# Patient Record
Sex: Male | Born: 1970 | Hispanic: No | Marital: Single | Smoking: Never smoker
Health system: Southern US, Community
[De-identification: ages and names within clinical notes are randomized; demographics above are authoritative.]

## PROBLEM LIST (undated history)

## (undated) DIAGNOSIS — I10 Essential (primary) hypertension: Secondary | ICD-10-CM

---

## 1998-04-04 ENCOUNTER — Encounter: Admission: RE | Admit: 1998-04-04 | Discharge: 1998-04-04 | Payer: Self-pay | Admitting: Family Medicine

## 2016-01-30 DIAGNOSIS — I10 Essential (primary) hypertension: Secondary | ICD-10-CM | POA: Insufficient documentation

## 2016-01-30 DIAGNOSIS — E785 Hyperlipidemia, unspecified: Secondary | ICD-10-CM | POA: Insufficient documentation

## 2016-08-24 DIAGNOSIS — R7303 Prediabetes: Secondary | ICD-10-CM | POA: Insufficient documentation

## 2017-04-05 DIAGNOSIS — M778 Other enthesopathies, not elsewhere classified: Secondary | ICD-10-CM | POA: Insufficient documentation

## 2019-05-25 ENCOUNTER — Other Ambulatory Visit: Payer: Self-pay

## 2019-05-25 ENCOUNTER — Emergency Department (HOSPITAL_BASED_OUTPATIENT_CLINIC_OR_DEPARTMENT_OTHER)
Admission: EM | Admit: 2019-05-25 | Discharge: 2019-05-25 | Disposition: A | Discharge status: Home / Self Care | Payer: BC Managed Care – PPO | Attending: Emergency Medicine | Admitting: Emergency Medicine

## 2019-05-25 ENCOUNTER — Encounter (HOSPITAL_BASED_OUTPATIENT_CLINIC_OR_DEPARTMENT_OTHER): Payer: Self-pay | Admitting: Emergency Medicine

## 2019-05-25 DIAGNOSIS — I10 Essential (primary) hypertension: Secondary | ICD-10-CM | POA: Insufficient documentation

## 2019-05-25 DIAGNOSIS — L03011 Cellulitis of right finger: Secondary | ICD-10-CM | POA: Diagnosis not present

## 2019-05-25 DIAGNOSIS — Z4800 Encounter for change or removal of nonsurgical wound dressing: Secondary | ICD-10-CM | POA: Diagnosis present

## 2019-05-25 DIAGNOSIS — Z5189 Encounter for other specified aftercare: Secondary | ICD-10-CM

## 2019-05-25 HISTORY — DX: Essential (primary) hypertension: I10

## 2019-05-25 NOTE — Discharge Instructions (Addendum)
Get help right away if: The area of redness is spreading, or you notice a red streak going away from your fingertip. You have a fever.

## 2019-05-25 NOTE — ED Provider Notes (Signed)
[facility] EMERGENCY DEPARTMENT Provider Note   CSN: [id number] Arrival date & time: 05/25/19  1526     History Chief Complaint  Patient presents with  . Wound Check    Frank Palmer is a 49 y.o. male who presents  For wound check of his finger. There is a language barrier and the daughter is interpreting. Patient had a paronychia of the right index finger.  He had some pain developing on somewhere ninth and then it developed pus which opened on the 28th.  Since then he has been to a clinic twice.   He is concerned that it is still firm to the touch and there is some peeling on the finger. The patient also had some itching without a rash after taking his oral antibiotics.  He has no fevers or chills.  HPI     Past Medical History:  Diagnosis Date  . Hypertension     There are no problems to display for this patient.   History reviewed. No pertinent surgical history.     No family history on file.  Social History   Tobacco Use  . Smoking status: Never Smoker  . Smokeless tobacco: Never Used  Substance Use Topics  . Alcohol use: Not Currently  . Drug use: Not on file    Home Medications Prior to Admission medications   Not on File    Allergies    Patient has no known allergies.  Review of Systems   Review of Systems  Constitutional: Negative for chills and fever.  Musculoskeletal: Negative for joint swelling.  Skin: Positive for wound.  Neurological: Negative for weakness and numbness.    Physical Exam Updated Vital Signs BP (!) 151/85 (BP Location: Left Arm)   Pulse 72   Temp 99 F (37.2 C) (Oral)   Resp 16   Ht 5\' 8"  (1.727 m)   Wt 62.6 kg   SpO2 100%   BMI 20.98 kg/m   Physical Exam Vitals and nursing note reviewed.  Constitutional:      General: He is not in acute distress.    Appearance: He is well-developed. He is not diaphoretic.  HENT:     Head: Normocephalic and atraumatic.  Eyes:     General: No scleral icterus.  Conjunctiva/sclera: Conjunctivae normal.  Cardiovascular:     Rate and Rhythm: Normal rate and regular rhythm.     Heart sounds: Normal heart sounds.  Pulmonary:     Effort: Pulmonary effort is normal. No respiratory distress.     Breath sounds: Normal breath sounds.  Abdominal:     Palpations: Abdomen is soft.     Tenderness: There is no abdominal tenderness.  Musculoskeletal:     Cervical back: Normal range of motion and neck supple.     Comments: R index finger with slight firmness, Mild desquamation on the radial nail edge, pink tissue. No tenderness, heat, warmth or swelling.    Skin:    General: Skin is warm and dry.  Neurological:     Mental Status: He is alert.  Psychiatric:        Behavior: Behavior normal.     Comments: Anxious, ruminating, obsessing     ED Results / Procedures / Treatments   Labs (all labs ordered are listed, but only abnormal results are displayed) Labs Reviewed - No data to display  EKG None  Radiology No results found.  Procedures Procedures (including critical care time)  Medications Ordered in ED Medications - No data to  display  ED Course  I have reviewed the triage vital signs and the nursing notes.  Pertinent labs & imaging results that were available during my care of the patient were reviewed by me and considered in my medical decision making (see chart for details).    MDM Rules/Calculators/A&P                      Patient with previous paronychia with normal healing tissue. Patient repeatedly obsesses and questions each detail of his finger he finds abnormal. His daughter confides that he is an extremely anxious person. I have reassured the patient that his finger looks great and he has nothing to worry about.  I have repeated multiple times reasons to have the finger reevaluated. Patient appears appropriate for discharge. He does not need follow up  Unless he develops sxs of reoccurring infection.    Final Clinical  Impression(s) / ED Diagnoses Final diagnoses:  Visit for wound check    Rx / DC Orders ED Discharge Orders    None       Margarita Mail, PA-C 05/27/19 0938    Malvin Johns, MD 06/07/19 [id number]

## 2019-05-25 NOTE — ED Triage Notes (Addendum)
Pt recently finished abx for an infected cuticle to R index finger. States he would like it rechecked. He speaks spanish. His daughter is interpreting.

## 2019-06-20 ENCOUNTER — Encounter (HOSPITAL_BASED_OUTPATIENT_CLINIC_OR_DEPARTMENT_OTHER): Payer: Self-pay | Admitting: *Deleted

## 2019-06-20 ENCOUNTER — Emergency Department (HOSPITAL_BASED_OUTPATIENT_CLINIC_OR_DEPARTMENT_OTHER)
Admission: EM | Admit: 2019-06-20 | Discharge: 2019-06-20 | Disposition: A | Discharge status: Home / Self Care | Payer: BC Managed Care – PPO | Attending: Emergency Medicine | Admitting: Emergency Medicine

## 2019-06-20 ENCOUNTER — Other Ambulatory Visit: Payer: Self-pay

## 2019-06-20 DIAGNOSIS — M79644 Pain in right finger(s): Secondary | ICD-10-CM | POA: Diagnosis present

## 2019-06-20 DIAGNOSIS — I10 Essential (primary) hypertension: Secondary | ICD-10-CM | POA: Diagnosis not present

## 2019-06-20 NOTE — ED Provider Notes (Signed)
[facility] EMERGENCY DEPARTMENT Provider Note   CSN: [id number] Arrival date & time: 06/20/19  1810     History Chief Complaint  Patient presents with  . Hand Injury    Frank Palmer is a 49 y.o. male who presents to the ED today complaining of continued pain to his right 2nd digit x 2 months. Per chart review pt was seen at urgent care on 12/15 for paronychia. He was discharged home with abx and returned 3 days later with continued pain. His abx were changed at that point to Augmentin x 10 days and he reports he finished the course. He reports they attempted I&D at that point with only bloody drainage. Pt states he went home and 2 days later attempted drainage himself with purulent material. Pt states that even despite finishing the abx he continues to have inflammation to the area. Pt denies any severe pain but reports he has mild throbbing pain when he puts his hand down by his side. He has not taken anything for the pain.   Per chart review pt was seen in the ED on 1/07 for continued pain and wound check. At that time pt's finger appeared to be without signs of infection and was well healed. He was reassured that his finger looked well but it appears he repeatedly obsessed over his finger and asked multiple questions regarding his fingers; there was concern for anxiety at that point per pt's daughter.    The history is provided by the patient. The history is limited by a language barrier. A language interpreter was used.       Past Medical History:  Diagnosis Date  . Hypertension     There are no problems to display for this patient.   History reviewed. No pertinent surgical history.     History reviewed. No pertinent family history.  Social History   Tobacco Use  . Smoking status: Never Smoker  . Smokeless tobacco: Never Used  Substance Use Topics  . Alcohol use: Not Currently  . Drug use: Not on file    Home Medications Prior to Admission medications    Not on File    Allergies    Patient has no known allergies.  Review of Systems   Review of Systems  Constitutional: Negative for chills and fever.  Musculoskeletal: Positive for arthralgias.    Physical Exam Updated Vital Signs BP 136/72   Pulse 78   Temp 98.7 F (37.1 C) (Oral)   Resp 18   SpO2 99%   Physical Exam Vitals and nursing note reviewed.  Constitutional:      Appearance: He is not ill-appearing.  HENT:     Head: Normocephalic and atraumatic.  Eyes:     Conjunctiva/sclera: Conjunctivae normal.  Cardiovascular:     Rate and Rhythm: Normal rate and regular rhythm.  Pulmonary:     Effort: Pulmonary effort is normal.     Breath sounds: Normal breath sounds.  Musculoskeletal:     Comments: No erythema, edema, or drainage to R index finger. No tenderness to palpation. Mild desquamation of nail fold on lateral aspect of finger. ROM intact to MCP, PIP, and DIP joint. No tenderness to flexor tendon. No circumferential swelling. 2+ radial pulse.   Skin:    General: Skin is warm and dry.     Coloration: Skin is not jaundiced.  Neurological:     Mental Status: He is alert.     ED Results / Procedures / Treatments   Labs (  all labs ordered are listed, but only abnormal results are displayed) Labs Reviewed - No data to display  EKG None  Radiology No results found.  Procedures Procedures (including critical care time)  Medications Ordered in ED Medications - No data to display  ED Course  I have reviewed the triage vital signs and the nursing notes.  Pertinent labs & imaging results that were available during my care of the patient were reviewed by me and considered in my medical decision making (see chart for details).  49 year old Spanish-speaking male who presents the ED for continued in of his right index finger status post paronychia in December where he was started on antibiotics and finished course of.  He has been seen multiple times in the  urgent care clinic as well as here in the ED for same with continued complaint of concern for infection.  On arrival to the ED today patient is afebrile, nontachycardic and nontachypneic.  There are no signs of infection to his nailbed to suggest paronychia.  No signs of felon.  No concern for flexor tenosynovitis.  His finger looks well-healed today.  I had lengthy discussion with him via interpretive services regarding his finger.  Pt continues to interrupt the interpretor and myself to repeat multiple times what has occurred over the past 2 months regarding his finger. Again his finger looks well healed today; there was concern about anxiety from his daughter during last ED visit and I feel that this is likely today. I have advised patient that he should follow up with his PCP regarding this and should take Ibuprofen and Tylenol as needed for pain. Strict return precautions have been discussed including signs of infection that are new in the past couple of days; pt continues to report he did have drainage at one point in December but I have advised that if he does not have anymore drainage,  New swelling, new redness that there are no signs of infection currently. Pt stable for discharge home.   This note was prepared using Dragon voice recognition software and may include unintentional dictation errors due to the inherent limitations of voice recognition software.     MDM Rules/Calculators/A&P                      Final Clinical Impression(s) / ED Diagnoses Final diagnoses:  Pain of finger of right hand    Rx / DC Orders ED Discharge Orders    None       Discharge Instructions     Please follow up with your PCP regarding your continued finger pain. Your finger does not appear infected today.   Take Ibuprofen and Tylenol as needed for pain.        Eustaquio Maize, PA-C 06/20/19 1906    Fredia Sorrow, MD 06/25/19 0800

## 2019-06-20 NOTE — Discharge Instructions (Signed)
Please follow up with your PCP regarding your continued finger pain. Your finger does not appear infected today.   Take Ibuprofen and Tylenol as needed for pain.

## 2019-06-20 NOTE — ED Notes (Signed)
ED Provider at bedside. 

## 2019-06-20 NOTE — ED Triage Notes (Addendum)
ipad translator used Pt c/o right index finger pain x 1 day , recent infection with completion of ABX

## 2019-07-11 ENCOUNTER — Other Ambulatory Visit: Payer: Self-pay

## 2019-07-11 ENCOUNTER — Ambulatory Visit: Payer: BC Managed Care – PPO | Admitting: Registered Nurse

## 2019-07-11 ENCOUNTER — Encounter: Payer: Self-pay | Admitting: Registered Nurse

## 2019-07-11 VITALS — BP 136/82 | HR 64 | Temp 98.0°F | Ht 68.0 in | Wt 129.8 lb

## 2019-07-11 DIAGNOSIS — R21 Rash and other nonspecific skin eruption: Secondary | ICD-10-CM

## 2019-07-11 DIAGNOSIS — S93529A Sprain of metatarsophalangeal joint of unspecified toe(s), initial encounter: Secondary | ICD-10-CM | POA: Diagnosis not present

## 2019-07-11 DIAGNOSIS — L03011 Cellulitis of right finger: Secondary | ICD-10-CM

## 2019-07-11 DIAGNOSIS — Z1329 Encounter for screening for other suspected endocrine disorder: Secondary | ICD-10-CM

## 2019-07-11 DIAGNOSIS — Z13228 Encounter for screening for other metabolic disorders: Secondary | ICD-10-CM

## 2019-07-11 DIAGNOSIS — Z130 Encounter for screening for diseases of the blood and blood-forming organs and certain disorders involving the immune mechanism: Secondary | ICD-10-CM

## 2019-07-11 DIAGNOSIS — Z13220 Encounter for screening for lipoid disorders: Secondary | ICD-10-CM

## 2019-07-11 MED ORDER — DOXYCYCLINE HYCLATE 100 MG PO TABS: 100.0000 mg | ORAL_TABLET | Freq: Two times a day (BID) | ORAL | 0 refills | Status: AC

## 2019-07-11 MED ORDER — MELOXICAM 7.5 MG PO TABS: 7.5000 mg | ORAL_TABLET | Freq: Every day | ORAL | 0 refills | Status: AC

## 2019-07-11 NOTE — Patient Instructions (Signed)
° ° ° °  If you have lab work done today you will be contacted with your lab results within the next 2 weeks.  If you have not heard from us then please contact us. The fastest way to get your results is to register for My Chart. ° ° °IF you received an x-ray today, you will receive an invoice from [facility]. Please contact  Radiology at [phone] with questions or concerns regarding your invoice.  ° °IF you received labwork today, you will receive an invoice from [facility]. Please contact [facility] at [phone] with questions or concerns regarding your invoice.  ° °Our billing staff will not be able to assist you with questions regarding bills from these companies. ° °You will be contacted with the lab results as soon as they are available. The fastest way to get your results is to activate your My Chart account. Instructions are located on the last page of this paperwork. If you have not heard from us regarding the results in 2 weeks, please contact this office. °  ° ° ° °

## 2019-07-11 NOTE — Progress Notes (Signed)
Established Patient Office Visit  Subjective:  Patient ID: Frank Palmer, male    DOB: 1970-09-20  Age: 49 y.o. MRN: [id number]  CC:  Chief Complaint  Patient presents with  . New Patient (Initial Visit)    establish care . right hand and index finger was infected two months ago and went to ED they told him it would go away over time but now the whole hand is starting to swell about three weeks ago. when it swell patient stated it burns bad but doesnt lose feeling.    HPI Frank Palmer presents for paronychia of digit on r hand  Originally seen by urgent care for this issue two months ago. Had not improved with keflex or augmentin. Briefly resolved on its own, now worsening. Finger tips are swollen, painful, and some drainage from base of nail. No numbness or weakness.   Also notes malar rash - has been ongoing for around 2 weeks. Improving. No discrete lesions, rash is poorly circumscribed.   Also states that he has pain in the joint of his great toe on his left foot. Aching pain when walking improves with rest. Slow onset. Has not happened before. No history of gout.   Past Medical History:  Diagnosis Date  . Hypertension     No past surgical history on file.  No family history on file.  Social History   Socioeconomic History  . Marital status: Single    Spouse name: Not on file  . Number of children: Not on file  . Years of education: Not on file  . Highest education level: Not on file  Occupational History  . Not on file  Tobacco Use  . Smoking status: Never Smoker  . Smokeless tobacco: Never Used  Substance and Sexual Activity  . Alcohol use: Not Currently  . Drug use: Not on file  . Sexual activity: Not on file  Other Topics Concern  . Not on file  Social History Narrative  . Not on file   Social Determinants of Health   Financial Resource Strain:   . Difficulty of Paying Living Expenses: Not on file  Food Insecurity:   . Worried About Ship broker in the Last Year: Not on file  . Ran Out of Food in the Last Year: Not on file  Transportation Needs:   . Lack of Transportation (Medical): Not on file  . Lack of Transportation (Non-Medical): Not on file  Physical Activity:   . Days of Exercise per Week: Not on file  . Minutes of Exercise per Session: Not on file  Stress:   . Feeling of Stress : Not on file  Social Connections:   . Frequency of Communication with Friends and Family: Not on file  . Frequency of Social Gatherings with Friends and Family: Not on file  . Attends Religious Services: Not on file  . Active Member of Clubs or Organizations: Not on file  . Attends Archivist Meetings: Not on file  . Marital Status: Not on file  Intimate Partner Violence:   . Fear of Current or Ex-Partner: Not on file  . Emotionally Abused: Not on file  . Physically Abused: Not on file  . Sexually Abused: Not on file    Outpatient Medications Prior to Visit  Medication Sig Dispense Refill  . atenolol-chlorthalidone (TENORETIC) 50-25 MG tablet Take 1 tablet by mouth daily.    . metFORMIN (GLUCOPHAGE) 500 MG tablet Take 500 mg by mouth daily with  breakfast.     No facility-administered medications prior to visit.    Allergies  Allergen Reactions  . Amoxicillin-Pot Clavulanate     ROS Review of Systems Per hpi   Objective:    Physical Exam  Constitutional: He is oriented to person, place, and time. He appears well-developed and well-nourished. No distress.  Cardiovascular: Normal rate and regular rhythm.  Pulmonary/Chest: Effort normal. No respiratory distress.  Musculoskeletal:        General: No tenderness, deformity or edema. Normal range of motion.  Neurological: He is alert and oriented to person, place, and time.  Skin: Skin is warm and dry. Rash (malar) noted. He is not diaphoretic. There is erythema (finger tips R hand). No pallor.  Psychiatric: He has a normal mood and affect. His behavior is normal.  Judgment and thought content normal.  Nursing note and vitals reviewed.   BP 136/82   Pulse 64   Temp 98 F (36.7 C) (Temporal)   Ht '5\' 8"'  (1.727 m)   Wt 129 lb 12.8 oz (58.9 kg)   SpO2 96%   BMI 19.74 kg/m  Wt Readings from Last 3 Encounters:  07/11/19 129 lb 12.8 oz (58.9 kg)  05/25/19 138 lb (62.6 kg)     Health Maintenance Due  Topic Date Due  . HIV Screening  02/11/1985    There are no preventive care reminders to display for this patient.  No results found for: TSH No results found for: WBC, HGB, HCT, MCV, PLT No results found for: NA, K, CHLORIDE, CO2, GLUCOSE, BUN, CREATININE, BILITOT, ALKPHOS, AST, ALT, PROT, ALBUMIN, CALCIUM, ANIONGAP, EGFR, GFR No results found for: CHOL No results found for: HDL No results found for: LDLCALC No results found for: TRIG No results found for: CHOLHDL No results found for: HGBA1C    Assessment & Plan:   Problem List Items Addressed This Visit    None    Visit Diagnoses    Lipid screening    -  Primary   Relevant Orders   Lipid Panel   Screening for endocrine, metabolic and immunity disorder       Relevant Orders   CBC with Differential   Hemoglobin A1c   Malar rash       Relevant Orders   ANA   Turf toe, initial encounter       Relevant Medications   meloxicam (MOBIC) 7.5 MG tablet   Paronychia of finger of right hand       Relevant Medications   doxycycline (VIBRA-TABS) 100 MG tablet      Meds ordered this encounter  Medications  . doxycycline (VIBRA-TABS) 100 MG tablet    Sig: Take 1 tablet (100 mg total) by mouth 2 (two) times daily.    Dispense:  20 tablet    Refill:  0    Order Specific Question:   Supervising Provider    Answer:   Delia Chimes A [id number]  . meloxicam (MOBIC) 7.5 MG tablet    Sig: Take 1 tablet (7.5 mg total) by mouth daily.    Dispense:  30 tablet    Refill:  0    Order Specific Question:   Supervising Provider    Answer:   Forrest Moron [id number]    Follow-up: No  follow-ups on file.   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 Will draw ANA and other labs to be certain, will follow up as warranted  Patient encouraged to call clinic with any questions, comments, or concerns.  Maximiano Coss, NP

## 2019-07-12 LAB — CBC WITH DIFFERENTIAL/PLATELET
Basophils Absolute: 0.1 10*3/uL (ref 0.0–0.2)
Basos: 1 %
EOS (ABSOLUTE): 0.5 10*3/uL — ABNORMAL HIGH (ref 0.0–0.4)
Eos: 8 %
Hematocrit: 49.2 % (ref 37.5–51.0)
Hemoglobin: 17 g/dL (ref 13.0–17.7)
Immature Grans (Abs): 0 10*3/uL (ref 0.0–0.1)
Immature Granulocytes: 0 %
Lymphocytes Absolute: 1.2 10*3/uL (ref 0.7–3.1)
Lymphs: 21 %
MCH: 29 pg (ref 26.6–33.0)
MCHC: 34.6 g/dL (ref 31.5–35.7)
MCV: 84 fL (ref 79–97)
Monocytes Absolute: 0.6 10*3/uL (ref 0.1–0.9)
Monocytes: 10 %
Neutrophils Absolute: 3.5 10*3/uL (ref 1.4–7.0)
Neutrophils: 60 %
Platelets: 259 10*3/uL (ref 150–450)
RBC: 5.87 x10E6/uL — ABNORMAL HIGH (ref 4.14–5.80)
RDW: 13.7 % (ref 11.6–15.4)
WBC: 5.8 10*3/uL (ref 3.4–10.8)

## 2019-07-12 LAB — LIPID PANEL
Chol/HDL Ratio: 4.1 ratio (ref 0.0–5.0)
Cholesterol, Total: 187 mg/dL (ref 100–199)
HDL: 46 mg/dL (ref >39)
LDL Chol Calc (NIH): 124 mg/dL — ABNORMAL HIGH (ref 0–99)
Triglycerides: 93 mg/dL (ref 0–149)
VLDL Cholesterol Cal: 17 mg/dL (ref 5–40)

## 2019-07-12 LAB — HEMOGLOBIN A1C
Est. average glucose Bld gHb Est-mCnc: 123 mg/dL
Hgb A1c MFr Bld: 5.9 % — ABNORMAL HIGH (ref 4.8–5.6)

## 2019-07-12 LAB — ANA: Anti Nuclear Antibody (ANA): NEGATIVE

## 2019-07-12 NOTE — Progress Notes (Signed)
Good afternoon,  If we could give Frank Palmer a call to let him know that his labs are not contributory to his symptoms, that would be great.  We did note that his sugar and his cholesterol were a little high - while this doesn't warrant medication at this time, he should try to improve his diet and exercise routines. It is okay to speak with his daughter, who interprets for him  Thank you,  Jari Sportsman, NP

## 2019-07-13 ENCOUNTER — Telehealth: Payer: Self-pay

## 2019-07-18 ENCOUNTER — Encounter: Payer: Self-pay | Admitting: Family Medicine

## 2019-07-18 ENCOUNTER — Ambulatory Visit: Payer: BC Managed Care – PPO | Admitting: Family Medicine

## 2019-07-18 ENCOUNTER — Other Ambulatory Visit: Payer: Self-pay | Admitting: Family Medicine

## 2019-07-18 ENCOUNTER — Other Ambulatory Visit: Payer: Self-pay

## 2019-07-18 ENCOUNTER — Ambulatory Visit (INDEPENDENT_AMBULATORY_CARE_PROVIDER_SITE_OTHER): Payer: BC Managed Care – PPO

## 2019-07-18 VITALS — BP 125/79 | HR 67 | Temp 98.7°F | Ht 68.0 in | Wt 131.0 lb

## 2019-07-18 DIAGNOSIS — M79644 Pain in right finger(s): Secondary | ICD-10-CM

## 2019-07-18 DIAGNOSIS — R202 Paresthesia of skin: Secondary | ICD-10-CM

## 2019-07-18 MED ORDER — GABAPENTIN 100 MG PO CAPS: 100.0000 mg | ORAL_CAPSULE | Freq: Three times a day (TID) | ORAL | 3 refills | Status: AC

## 2019-07-18 NOTE — Patient Instructions (Signed)
° ° ° °  If you have lab work done today you will be contacted with your lab results within the next 2 weeks.  If you have not heard from us then please contact us. The fastest way to get your results is to register for My Chart. ° ° °IF you received an x-ray today, you will receive an invoice from [facility]. Please contact [facility] at [phone] with questions or concerns regarding your invoice.  ° °IF you received labwork today, you will receive an invoice from [facility]. Please contact [facility] at [phone] with questions or concerns regarding your invoice.  ° °Our billing staff will not be able to assist you with questions regarding bills from these companies. ° °You will be contacted with the lab results as soon as they are available. The fastest way to get your results is to activate your My Chart account. Instructions are located on the last page of this paperwork. If you have not heard from us regarding the results in 2 weeks, please contact this office. °  ° ° ° °

## 2019-07-18 NOTE — Progress Notes (Signed)
3/2/20213:08 PM  Frank Palmer 1970-12-12, 49 y.o., male [id number]  Chief Complaint  Patient presents with  . Follow-up    R hand index finger,     HPI:   Patient is a 49 y.o. male with past medical history significant for prediabetes and HLP who presents today with hand and leg paresthesia  Patient presents for concerns of paresthesia He reports that it started after being treated for paronychia of right index finger in dec 2020 He reports that since then he feels both his hands and toes are swollen and having burning pain He denies any weakness or changes in color He has seen multiple providers for this CBC, ANA, A1c normal  Depression screen [facility] 2/9 07/18/2019 07/11/2019  Decreased Interest 0 0  Down, Depressed, Hopeless 0 0  PHQ - 2 Score 0 0    Fall Risk  07/18/2019 07/11/2019  Falls in the past year? 0 0  Number falls in past yr: 0 0  Injury with Fall? 0 -  Follow up Falls evaluation completed Falls evaluation completed     Allergies  Allergen Reactions  . Amoxicillin-Pot Clavulanate     Rash, itching     Prior to Admission medications   Medication Sig Start Date End Date Taking? Authorizing Provider  atenolol-chlorthalidone (TENORETIC) 50-25 MG tablet Take 1 tablet by mouth daily.   Yes [provider]  doxycycline (VIBRA-TABS) 100 MG tablet Take 1 tablet (100 mg total) by mouth 2 (two) times daily. 07/11/19  Yes Janeece Agee, NP  meloxicam (MOBIC) 7.5 MG tablet Take 1 tablet (7.5 mg total) by mouth daily. 07/11/19  Yes Janeece Agee, NP  metFORMIN (GLUCOPHAGE) 500 MG tablet Take 500 mg by mouth daily with breakfast.   Yes [provider]    Past Medical History:  Diagnosis Date  . Hypertension     No past surgical history on file.  Social History   Tobacco Use  . Smoking status: Never Smoker  . Smokeless tobacco: Never Used  Substance Use Topics  . Alcohol use: Not Currently    No family history on file.  Review of  Systems  Constitutional: Negative for chills and fever.  Respiratory: Negative for cough and shortness of breath.   Cardiovascular: Negative for chest pain, palpitations and leg swelling.  Gastrointestinal: Negative for abdominal pain, nausea and vomiting.   Per hpi   OBJECTIVE:  Today's Vitals   07/18/19 1503  BP: 125/79  Pulse: 67  Temp: 98.7 F (37.1 C)  TempSrc: Temporal  SpO2: 97%  Weight: 131 lb (59.4 kg)  Height: 5\' 8"  (1.727 m)   Body mass index is 19.92 kg/m.   Physical Exam Vitals and nursing note reviewed.  Constitutional:      Appearance: He is well-developed.  HENT:     Head: Normocephalic and atraumatic.  Eyes:     Conjunctiva/sclera: Conjunctivae normal.     Pupils: Pupils are equal, round, and reactive to light.  Cardiovascular:     Rate and Rhythm: Normal rate and regular rhythm.     Pulses: Normal pulses.     Heart sounds: No murmur. No friction rub. No gallop.   Pulmonary:     Effort: Pulmonary effort is normal.     Breath sounds: Normal breath sounds. No wheezing or rales.  Musculoskeletal:        General: No swelling or deformity.     Cervical back: Neck supple.  Skin:    General: Skin is warm and dry.  Neurological:     Mental Status: He is alert and oriented to person, place, and time.     Sensory: Sensation is intact.     Motor: No weakness.     Deep Tendon Reflexes: Reflexes are normal and symmetric.     Comments: Neg phalen and tinsel     No results found for this or any previous visit (from the past 24 hour(s)).  DG Hand Complete Right  Result Date: 07/18/2019 CLINICAL DATA:  Finger pain EXAM: RIGHT HAND - COMPLETE 3+ VIEW COMPARISON:  None. FINDINGS: There is no evidence of fracture or dislocation. There is no evidence of arthropathy or other focal bone abnormality. Soft tissues are unremarkable. IMPRESSION: Negative. Electronically Signed   By: Franchot Gallo M.D.   On: 07/18/2019 15:55     ASSESSMENT and PLAN  1. Finger  pain, right 2. Paresthesia Exam and evaluation reassuring. Trial of gabapentin, reviewed r/se/b. Consider titration.  Other orders - gabapentin (NEURONTIN) 100 MG capsule; Take 1 capsule (100 mg total) by mouth 3 (three) times daily.  No follow-ups on file.    Rutherford Guys, MD [facility] [place], [place] [address] Ph.  [phone] Fax [phone]

## 2019-07-28 NOTE — Telephone Encounter (Signed)
No action needed

## 2021-06-05 IMAGING — DX DG HAND COMPLETE 3+V*R*
3 series · 3 of 3 positions shown · non-contrast
Comparison: None.

CLINICAL DATA: Finger pain

EXAM:
RIGHT HAND - COMPLETE 3+ VIEW

[hand pa]
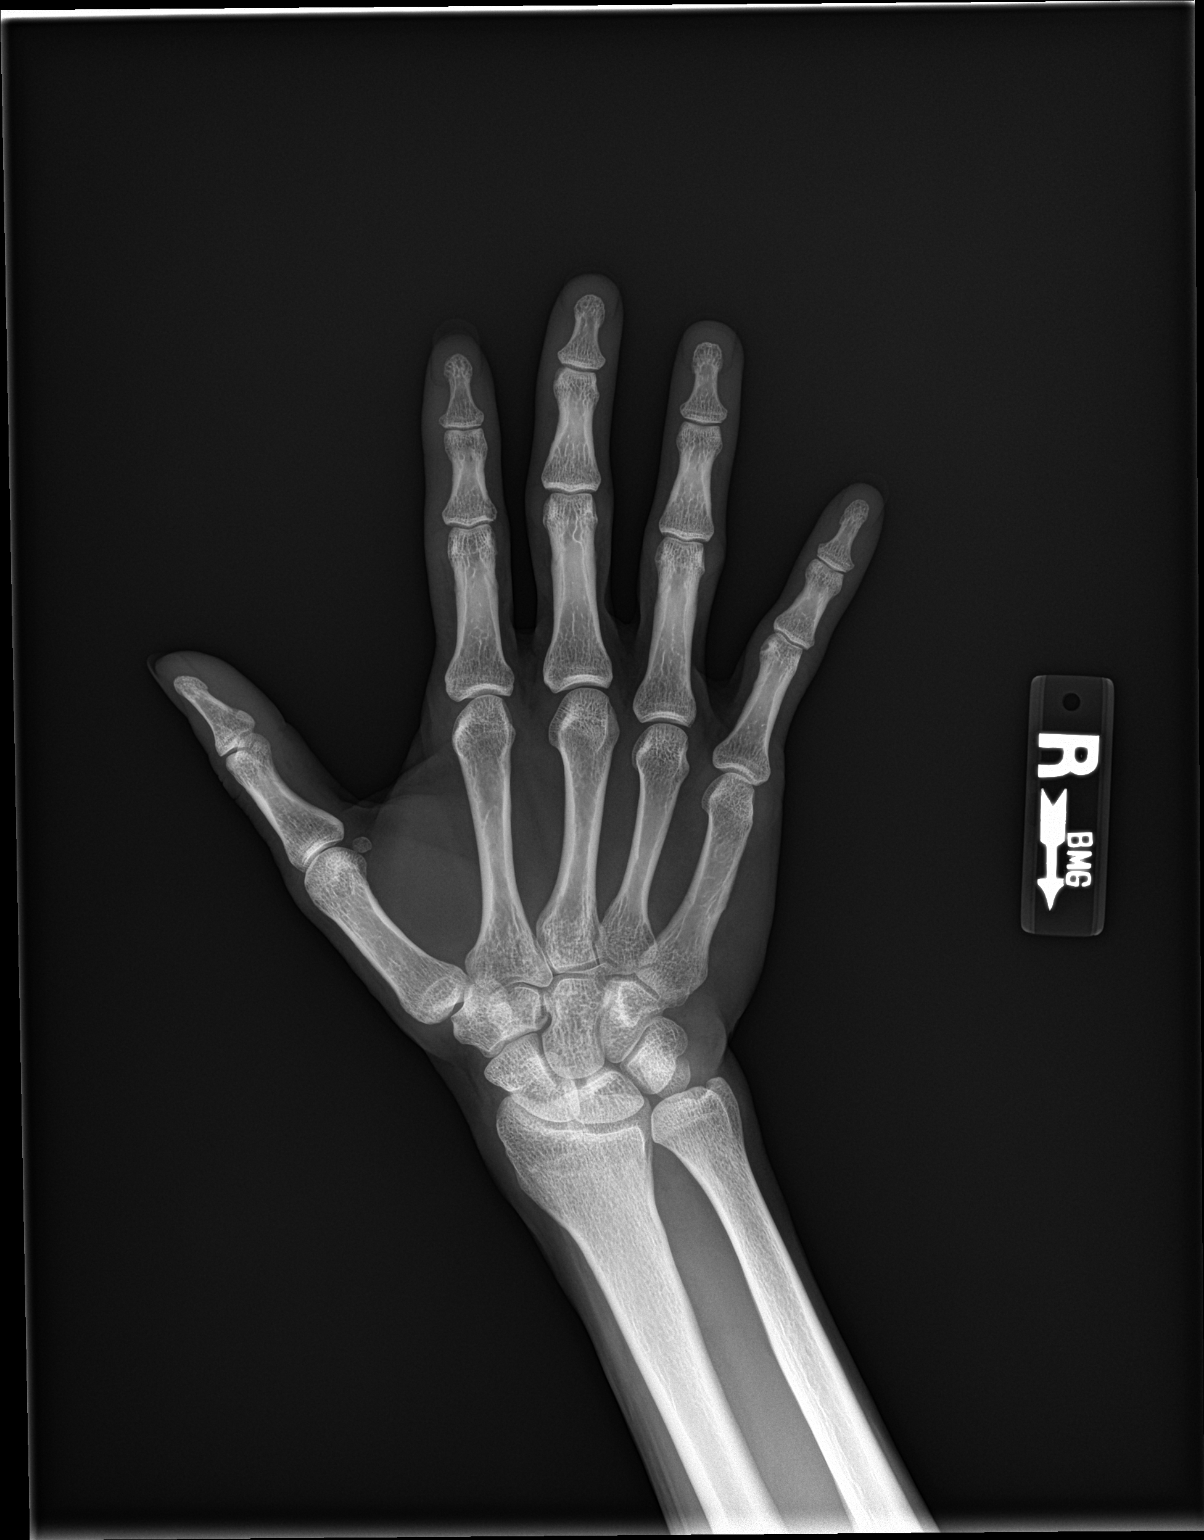

[hand obl]
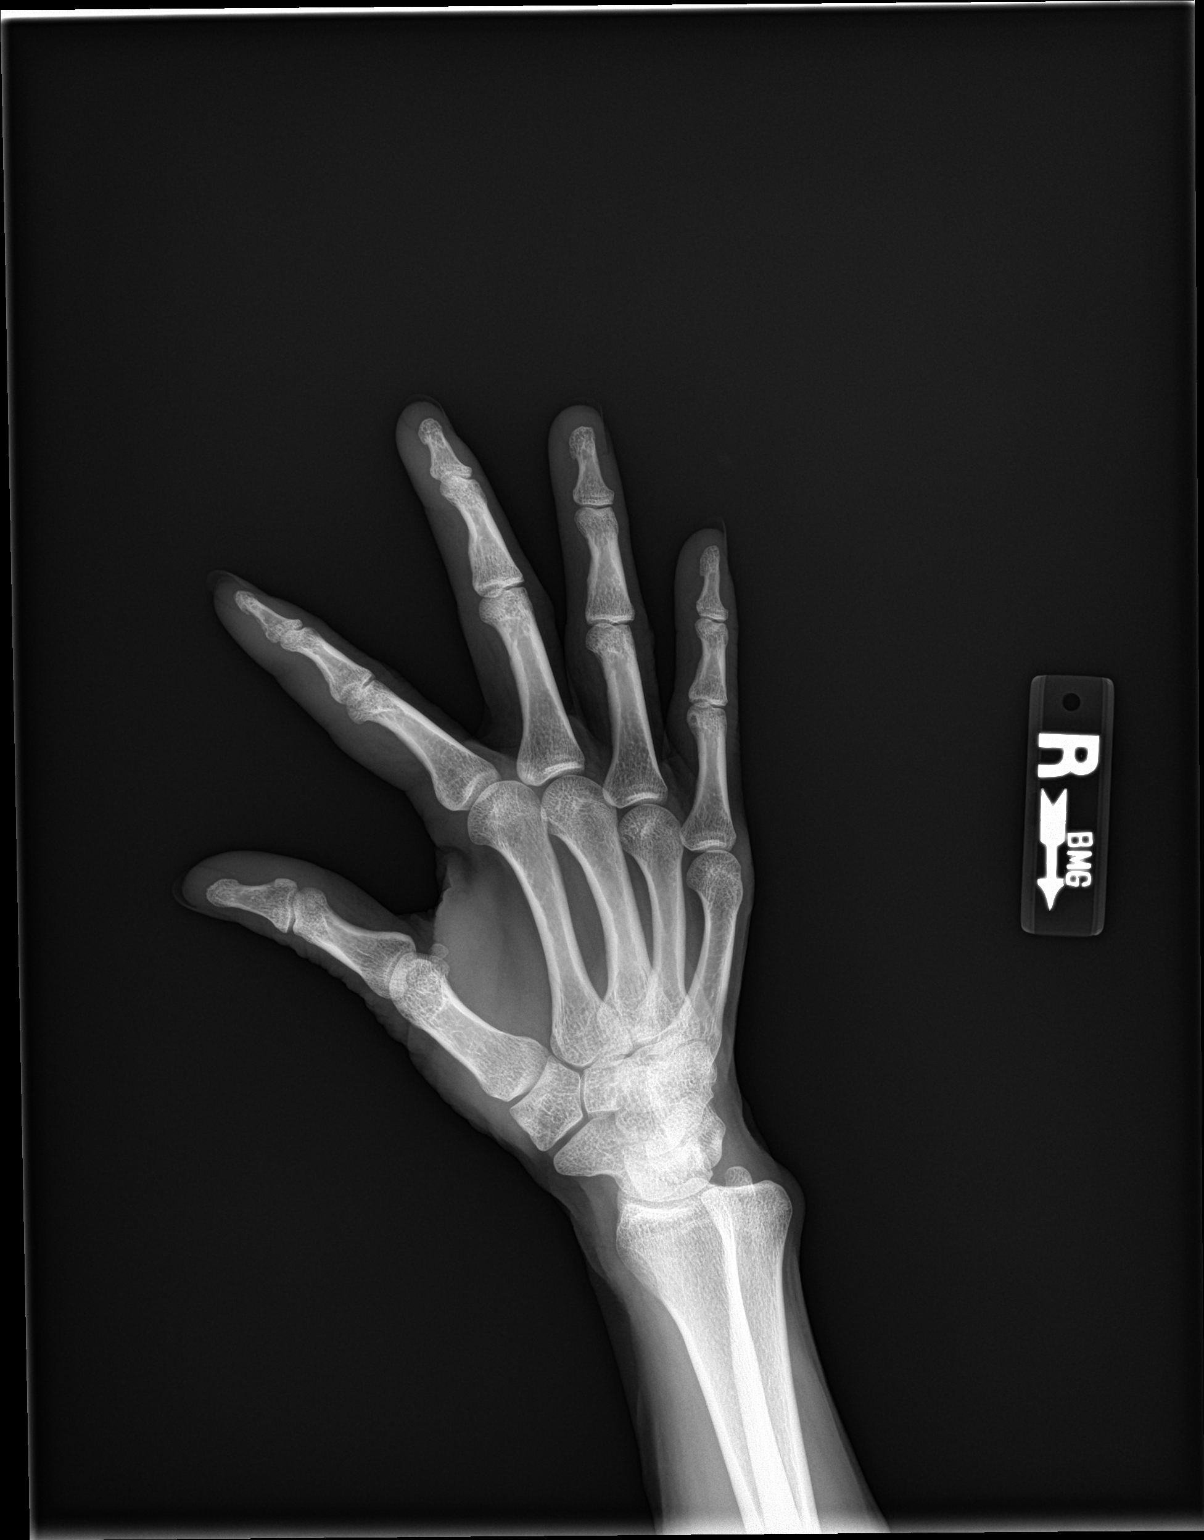

[hand lat]
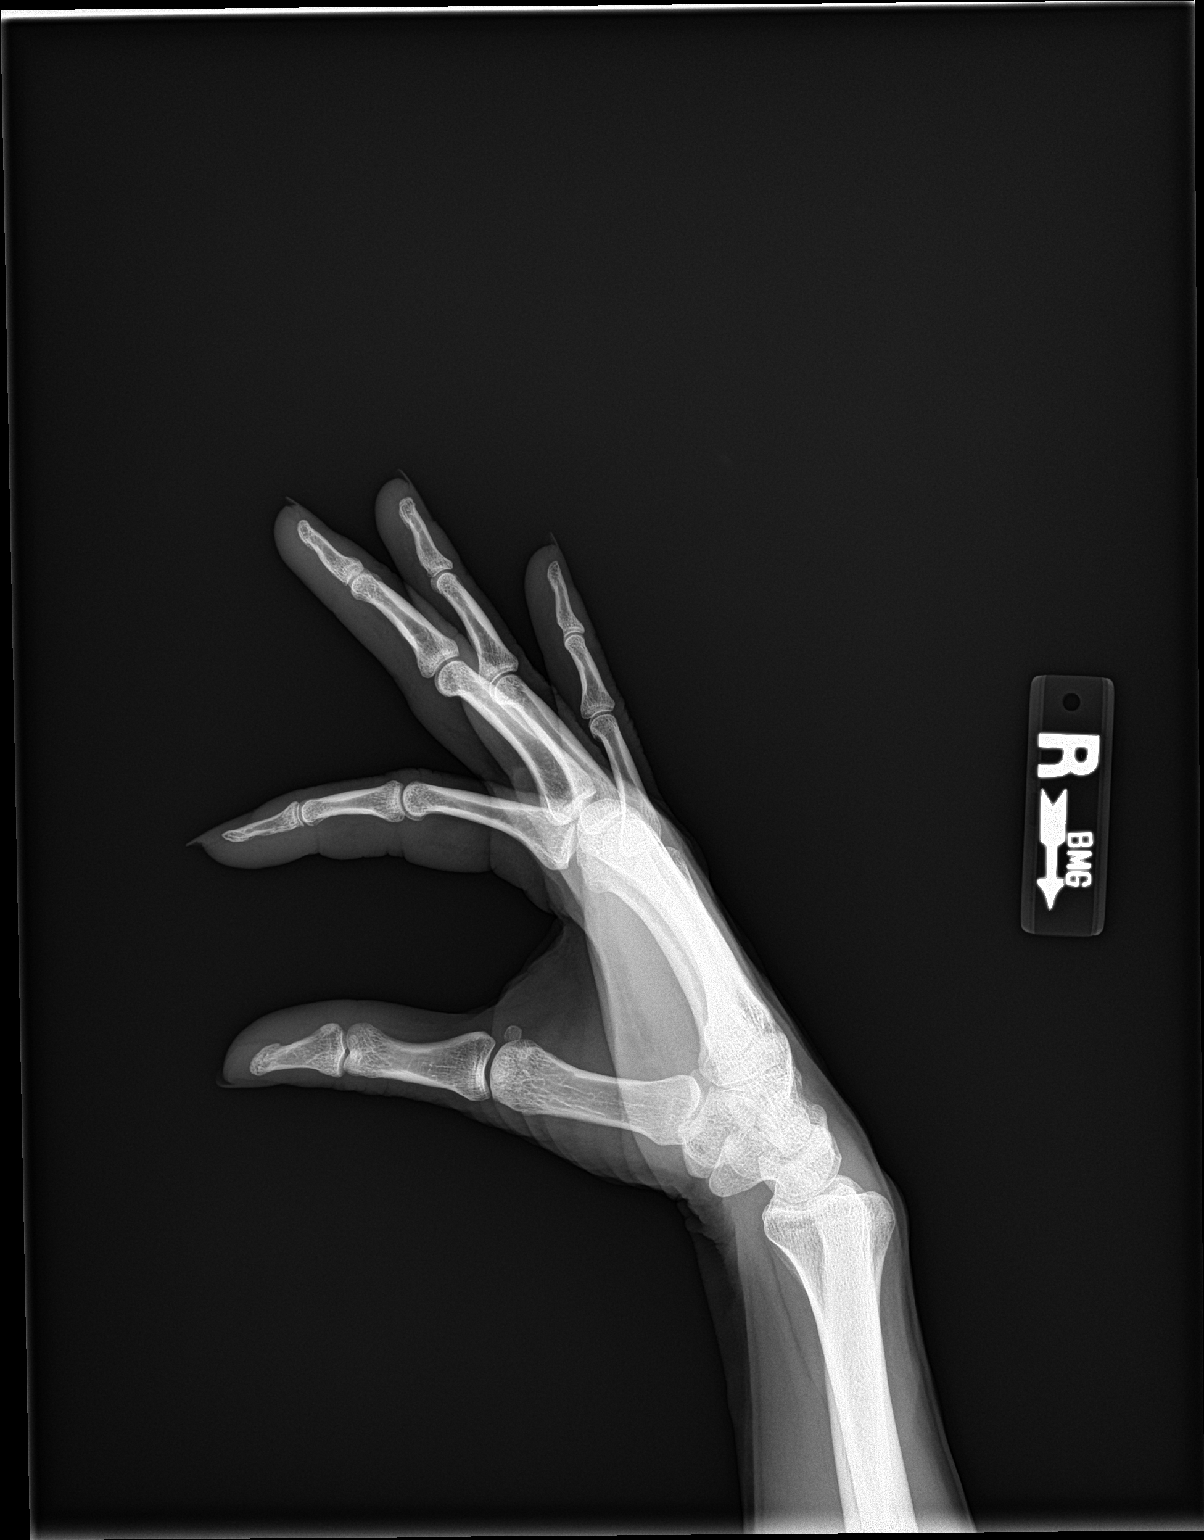

[3 of 3 positions shown; findings below may reference images not displayed]

FINDINGS: There is no evidence of fracture or dislocation. There is no
evidence of arthropathy or other focal bone abnormality. Soft
tissues are unremarkable.
IMPRESSION: Negative.

## 2021-07-09 ENCOUNTER — Other Ambulatory Visit: Payer: Self-pay

## 2021-07-09 ENCOUNTER — Encounter (HOSPITAL_BASED_OUTPATIENT_CLINIC_OR_DEPARTMENT_OTHER): Payer: Self-pay

## 2021-07-09 ENCOUNTER — Emergency Department (HOSPITAL_BASED_OUTPATIENT_CLINIC_OR_DEPARTMENT_OTHER)
Admission: EM | Admit: 2021-07-09 | Discharge: 2021-07-09 | Disposition: A | Discharge status: Home / Self Care | Payer: Worker's Compensation | Attending: Emergency Medicine | Admitting: Emergency Medicine

## 2021-07-09 DIAGNOSIS — S01311A Laceration without foreign body of right ear, initial encounter: Secondary | ICD-10-CM | POA: Insufficient documentation

## 2021-07-09 DIAGNOSIS — Z79899 Other long term (current) drug therapy: Secondary | ICD-10-CM | POA: Insufficient documentation

## 2021-07-09 DIAGNOSIS — Y990 Civilian activity done for income or pay: Secondary | ICD-10-CM | POA: Insufficient documentation

## 2021-07-09 DIAGNOSIS — I10 Essential (primary) hypertension: Secondary | ICD-10-CM | POA: Diagnosis not present

## 2021-07-09 DIAGNOSIS — Z23 Encounter for immunization: Secondary | ICD-10-CM | POA: Insufficient documentation

## 2021-07-09 DIAGNOSIS — W268XXA Contact with other sharp object(s), not elsewhere classified, initial encounter: Secondary | ICD-10-CM | POA: Insufficient documentation

## 2021-07-09 DIAGNOSIS — S0991XA Unspecified injury of ear, initial encounter: Secondary | ICD-10-CM | POA: Diagnosis present

## 2021-07-09 MED ORDER — LIDOCAINE HCL (PF) 1 % IJ SOLN
5.0000 mL | Freq: Once | INTRAMUSCULAR | Status: AC
  Administered 2021-07-09: 5 mL via INTRADERMAL
  Filled 2021-07-09: qty 5

## 2021-07-09 MED ORDER — TETANUS-DIPHTH-ACELL PERTUSSIS 5-2.5-18.5 LF-MCG/0.5 IM SUSY
0.5000 mL | PREFILLED_SYRINGE | Freq: Once | INTRAMUSCULAR | Status: AC
  Administered 2021-07-09: 0.5 mL via INTRAMUSCULAR
  Filled 2021-07-09: qty 0.5

## 2021-07-09 NOTE — ED Provider Notes (Signed)
[facility] EMERGENCY DEPARTMENT Provider Note   CSN: [id number] Arrival date & time: 07/09/21  1617     History Chief Complaint  Patient presents with   Ear Injury    Frank Palmer is a 51 y.o. male HTN, dyslipidemia, and prediabetes presents to the ER for evaluation of right ear laceration after it was cut by a piece of aluminum around 1 hour ago. He reports that his last tetanus was around 10 years ago or maybe more. Denies any LOC, head injury, or any other injury. Allergic to Augmentin.   HPI     Home Medications Prior to Admission medications   Medication Sig Start Date End Date Taking? Authorizing Provider  atenolol-chlorthalidone (TENORETIC) 50-25 MG tablet Take 1 tablet by mouth daily.    [provider]  doxycycline (VIBRA-TABS) 100 MG tablet Take 1 tablet (100 mg total) by mouth 2 (two) times daily. 07/11/19   Janeece Agee, NP  gabapentin (NEURONTIN) 100 MG capsule Take 1 capsule (100 mg total) by mouth 3 (three) times daily. 07/18/19   Lezlie Lye, Meda Coffee, MD  meloxicam (MOBIC) 7.5 MG tablet Take 1 tablet (7.5 mg total) by mouth daily. 07/11/19   Janeece Agee, NP  metFORMIN (GLUCOPHAGE) 500 MG tablet Take 500 mg by mouth daily with breakfast.    [provider]      Allergies    Amoxicillin-pot clavulanate    Review of Systems   Review of Systems  Skin:  Positive for wound.   Physical Exam Updated Vital Signs BP (!) 149/80 (BP Location: Left Arm)    Pulse 76    Temp 98.6 F (37 C) (Oral)    Resp 18    Ht 5\' 8"  (1.727 m)    Wt 66.7 kg    SpO2 97%    BMI 22.35 kg/m  Physical Exam Vitals and nursing note reviewed.  Constitutional:      Appearance: Normal appearance.  HENT:     Right Ear: Laceration present.     Ears:      Comments: Superficial laceration without cartilage involvement. No foreign body noted.  Eyes:     General: No scleral icterus. Pulmonary:     Effort: Pulmonary effort is normal. No respiratory distress.   Skin:    General: Skin is dry.     Findings: No rash.  Neurological:     General: No focal deficit present.     Mental Status: He is alert. Mental status is at baseline.  Psychiatric:        Mood and Affect: Mood normal.    ED Results / Procedures / Treatments   Labs (all labs ordered are listed, but only abnormal results are displayed) Labs Reviewed - No data to display  EKG None  Radiology No results found.  Procedures .Marland KitchenLaceration Repair  Date/Time: 07/09/2021 4:40 PM Performed by: Achille Rich, PA-C Authorized by: Achille Rich, PA-C   Consent:    Consent obtained:  Verbal   Consent given by:  Patient   Risks discussed:  Infection, need for additional repair, pain, poor cosmetic result and poor wound healing   Alternatives discussed:  No treatment and delayed treatment Universal protocol:    Procedure explained and questions answered to patient or proxy's satisfaction: yes     Relevant documents present and verified: no     Test results available: no     Imaging studies available: no     Required blood products, implants, devices, and special equipment available: no  Site/side marked: no     Immediately prior to procedure, a time out was called: no     Patient identity confirmed:  Verbally with patient Anesthesia:    Anesthesia method:  Local infiltration   Local anesthetic:  Lidocaine 1% w/o epi Laceration details:    Location:  Ear   Ear location:  R ear   Length (cm):  2   Depth (mm):  2 Pre-procedure details:    Preparation:  Patient was prepped and draped in usual sterile fashion Exploration:    Hemostasis achieved with:  Direct pressure Treatment:    Area cleansed with:  Shur-Clens and saline   Amount of cleaning:  Standard   Irrigation solution:  Sterile saline   Irrigation method:  Syringe and tap Skin repair:    Repair method:  Sutures   Suture size:  5-0   Suture material:  Prolene   Suture technique:  Simple interrupted   Number of  sutures:  2 Approximation:    Approximation:  Close Repair type:    Repair type:  Simple Post-procedure details:    Dressing:  Open (no dressing)   Procedure completion:  Tolerated well, no immediate complications   Medications Ordered in ED Medications - No data to display  ED Course/ Medical Decision Making/ A&P                           Medical Decision Making Risk Prescription drug management.  51 year old male presents emergency department for evaluation of right ear laceration after being cut by a piece of aluminum.  Attending assessed at bedside and recommended suture repair without any antibiotics for at home.  Superficial wound without any cartilage involvement.  Able to fully visualize the depth of the wound and no foreign body noted.  Wound was cleaned with saline and Shur-Clens.  2 simple interrupted sutures of 5-0 Prolene were placed.  Stasis was achieved with direct pressure.  The patient tolerated the procedure well without any complications.  Recommended he follow-up with ENT within the week for suture removal and wound recheck.  If he is unable to follow-up with ENT, he can see his primary care provider for suture mobile.  If reasonable to follow-up with his primary care provider in a week, he is to return to the urgent care or emergency department for suture removal and wound recheck.  I advised him to clean up the wound daily and to keep it clean and not contaminated.  Given a work note as he wears earmuffs and earplugs and was concerned about the contamination of his sutures or messing with them.  Work note provided for work restrictions.  Strict return precautions were discussed with the patient.  Patient agrees to plan.  Patient is stable being discharged home in good condition.  Tetanus was updated.  I offered translator services to the patient multiple times who refused and says that he speaks "good English".  Coworker at bedside who speaks [place].  My discharge  instructions were given in English with the translation to [place].  I discussed this case with my attending physician who cosigned this note including patient's presenting symptoms, physical exam, and planned diagnostics and interventions. Attending physician stated agreement with plan or made changes to plan which were implemented.   Attending physician assessed patient at bedside.   Final Clinical Impression(s) / ED Diagnoses Final diagnoses:  Laceration of auricle of right ear, initial encounter    Rx / DC  Orders ED Discharge Orders     None         Sherrell Puller, [place] 07/09/21 1812    Lucrezia Starch, MD 07/09/21 [phone]

## 2021-07-09 NOTE — ED Notes (Signed)
ED Provider at bedside. 

## 2021-07-09 NOTE — Discharge Instructions (Addendum)
You are seen in today for evaluation of ear ear laceration.  We have cleaned the wound and placed 2 sutures.  You will need to follow-up with ENT in 1 week for suture removal/wound check.  If you are not able to be seen in a week, please follow-up with your PCP, urgent care, or the emergency department for wound recheck and suture removal.  Please keep the area clean and dry.  You can clean daily with Dial soap and warm water.  Make sure to keep the ear as dry as possible afterwards.  If you have any persistent bleeding, fever, increased swelling, increased pain, or abnormal discharge from the wound, please seek reevaluation soon.  -------------------------------------------------------  Se le ve hoy para una evaluacin de laceracin del odo. Hemos limpiado la herida y colocado 2 puntos. Deber realizar un seguimiento con otorrinolaringlogo en 1 semana para Environmental education officer herida. Si no puede ser atendido [employer], haga un seguimiento con su PCP, atencin de urgencia o el departamento de emergencias para volver a revisar la herida y Oceanographer las suturas. Por favor, mantenga el rea limpia y [place]. Se puede limpiar a diario con jabn Dial y agua tibia. Asegrese de mantener el odo lo ms seco posible despus. Si tiene [employer], fiebre, aumento de la hinchazn, aumento del dolor o secrecin anormal de la herida, busque una reevaluacin pronto.

## 2021-07-09 NOTE — ED Triage Notes (Signed)
Through onsite spanish interpreter and pt speaks some english-right ear was cut with a piece of aluminum at work ~315pm-NAD-steady gait
